# Patient Record
Sex: Female | Born: 1980 | Race: Asian | Hispanic: No | Marital: Married | Smoking: Never smoker
Health system: Southern US, Community
[De-identification: ages and names within clinical notes are randomized; demographics above are authoritative.]

## PROBLEM LIST (undated history)

## (undated) DIAGNOSIS — D649 Anemia, unspecified: Secondary | ICD-10-CM

## (undated) DIAGNOSIS — Z0000 Encounter for general adult medical examination without abnormal findings: Secondary | ICD-10-CM

## (undated) DIAGNOSIS — I10 Essential (primary) hypertension: Secondary | ICD-10-CM

## (undated) DIAGNOSIS — D563 Thalassemia minor: Secondary | ICD-10-CM

## (undated) DIAGNOSIS — I671 Cerebral aneurysm, nonruptured: Secondary | ICD-10-CM

## (undated) DIAGNOSIS — R109 Unspecified abdominal pain: Secondary | ICD-10-CM

## (undated) DIAGNOSIS — E785 Hyperlipidemia, unspecified: Secondary | ICD-10-CM

## (undated) HISTORY — DX: Anemia, unspecified: D64.9

## (undated) HISTORY — DX: Cerebral aneurysm, nonruptured: I67.1

## (undated) HISTORY — DX: Encounter for general adult medical examination without abnormal findings: Z00.00

## (undated) HISTORY — DX: Thalassemia minor: D56.3

## (undated) HISTORY — DX: Hyperlipidemia, unspecified: E78.5

## (undated) HISTORY — DX: Unspecified abdominal pain: R10.9

## (undated) HISTORY — PX: OVARIAN CYST REMOVAL: SHX89

## (undated) HISTORY — DX: Essential (primary) hypertension: I10

---

## 2011-05-03 DIAGNOSIS — I671 Cerebral aneurysm, nonruptured: Secondary | ICD-10-CM

## 2011-05-03 HISTORY — DX: Cerebral aneurysm, nonruptured: I67.1

## 2011-05-03 HISTORY — PX: OTHER SURGICAL HISTORY: SHX169

## 2012-05-28 ENCOUNTER — Ambulatory Visit (INDEPENDENT_AMBULATORY_CARE_PROVIDER_SITE_OTHER): Payer: BC Managed Care – PPO | Admitting: Family Medicine

## 2012-05-28 ENCOUNTER — Encounter: Payer: Self-pay | Admitting: Family Medicine

## 2012-05-28 VITALS — BP 110/80 | HR 78 | Temp 98.1°F | Ht 63.5 in | Wt 147.0 lb

## 2012-05-28 DIAGNOSIS — I10 Essential (primary) hypertension: Secondary | ICD-10-CM | POA: Insufficient documentation

## 2012-05-28 DIAGNOSIS — D563 Thalassemia minor: Secondary | ICD-10-CM | POA: Insufficient documentation

## 2012-05-28 DIAGNOSIS — Z0000 Encounter for general adult medical examination without abnormal findings: Secondary | ICD-10-CM

## 2012-05-28 DIAGNOSIS — I671 Cerebral aneurysm, nonruptured: Secondary | ICD-10-CM | POA: Insufficient documentation

## 2012-05-28 HISTORY — DX: Thalassemia minor: D56.3

## 2012-05-28 HISTORY — DX: Encounter for general adult medical examination without abnormal findings: Z00.00

## 2012-05-28 HISTORY — DX: Essential (primary) hypertension: I10

## 2012-05-28 MED ORDER — LISINOPRIL-HYDROCHLOROTHIAZIDE 20-25 MG PO TABS: 1.0000 | ORAL_TABLET | Freq: Every day | ORAL | Status: DC

## 2012-05-28 MED ORDER — METOPROLOL SUCCINATE ER 25 MG PO TB24: 25.0000 mg | ORAL_TABLET | Freq: Every day | ORAL | Status: DC

## 2012-05-28 NOTE — Progress Notes (Signed)
Patient ID: Sherri Underwood, female   DOB: Aug 21, 1980, 32 y.o.   MRN: [id number] Sherri Underwood [id number] [birth year] 05/28/2012      Progress Note New Patient  Subjective  Chief Complaint  Chief Complaint  Patient presents with  . Establish Care    new patient    HPI  32 year old female in today for new patient appointment. She has recently moved here from [place]. Her biggest of concern is that last year on 05/07/2011 she had a brain aneurysm rupture and spent 21 days in the hospital the lesion was coiled in she's been stable since that seemed to referral to a neurosurgeon in the area for surveillance. She's not complaining of any headaches or neurologic concerns at this time. She does note she has had some trouble with her blood pressure. Her blood pressures as high as 167/106. She has a Mirena IUD which is in need of changing sounds is going to establish with OB/GYN. She follows a vegetarian diet although she does eat eggs and dairy. During her last pregnancy she had high blood pressure and tolerated metoprolol.  Past Medical History  Diagnosis Date  . Hypertension   . Brain aneurysm 2-13  . HTN (hypertension) 05/28/2012  . Brain aneurysm 05/28/2012  . Thalassemia trait 05/28/2012  . Preventative health care 05/28/2012    Past Surgical History  Procedure Laterality Date  . Brain aneursym surgery  05-2011  . Ovarian cyst removal      on right, required 2 surgeries    Family History  Problem Relation Age of Onset  . Hypertension Mother   . Hyperlipidemia Mother   . Thyroid disease Mother   . Hypertension Father   . Alzheimer's disease Maternal Grandmother   . Hypertension Maternal Grandmother   . Hyperlipidemia Maternal Grandmother   . Diabetes Maternal Grandmother     type 2  . Parkinson's disease Maternal Grandfather   . Hypertension Paternal Grandfather   . Stroke Paternal Grandfather     History   Social History  . Marital Status: Married    Spouse Name: N/A     Number of Children: N/A  . Years of Education: N/A   Occupational History  . Not on file.   Social History Main Topics  . Smoking status: Never Smoker   . Smokeless tobacco: Never Used  . Alcohol Use: Yes     Comment: occasionally  . Drug Use: No  . Sexually Active: Yes -- Female partner(s)   Other Topics Concern  . Not on file   Social History Narrative  . No narrative on file    No current outpatient prescriptions on file prior to visit.   No current facility-administered medications on file prior to visit.    No Known Allergies  Review of Systems  Review of Systems  Constitutional: Negative for fever and malaise/fatigue.  HENT: Negative for congestion.   Eyes: Negative for pain and discharge.  Respiratory: Negative for shortness of breath.   Cardiovascular: Negative for chest pain, palpitations and leg swelling.  Gastrointestinal: Negative for nausea, abdominal pain and diarrhea.  Genitourinary: Negative for dysuria.  Musculoskeletal: Negative for falls.  Skin: Negative for rash.  Neurological: Negative for loss of consciousness and headaches.  Endo/Heme/Allergies: Negative for polydipsia.  Psychiatric/Behavioral: Negative for depression and suicidal ideas. The patient is not nervous/anxious and does not have insomnia.     Objective  BP 110/80  Pulse 78  Temp(Src) 98.1 F (36.7 C) (Oral)  Ht 5' 3.5" (1.613 m)  Wt 147 lb (66.679 kg)  BMI 25.63 kg/m2  SpO2 92%  LMP 05/02/2012  Physical Exam  Physical Exam  Constitutional: She is oriented to person, place, and time and well-developed, well-nourished, and in no distress. No distress.  HENT:  Head: Normocephalic and atraumatic.  Right Ear: External ear normal.  Left Ear: External ear normal.  Nose: Nose normal.  Mouth/Throat: Oropharynx is clear and moist. No oropharyngeal exudate.  Eyes: Conjunctivae are normal. Pupils are equal, round, and reactive to light. Right eye exhibits no discharge. Left  eye exhibits no discharge. No scleral icterus.  Neck: Normal range of motion. Neck supple. No thyromegaly present.  Cardiovascular: Normal rate, regular rhythm, normal heart sounds and intact distal pulses.   No murmur heard. Pulmonary/Chest: Effort normal and breath sounds normal. No respiratory distress. She has no wheezes. She has no rales.  Abdominal: Soft. Bowel sounds are normal. She exhibits no distension and no mass. There is no tenderness.  Musculoskeletal: Normal range of motion. She exhibits no edema and no tenderness.  Lymphadenopathy:    She has no cervical adenopathy.  Neurological: She is alert and oriented to person, place, and time. She has normal reflexes. No cranial nerve deficit. Coordination normal.  Skin: Skin is warm and dry. No rash noted. She is not diaphoretic.  Psychiatric: Mood, memory and affect normal.       Assessment & Plan  Preventative health care Recently here from [place]. Will request old records. Agrees to return for fasting labs and followup appointment in 2 months time.  Brain aneurysm Will refer to [facility] neurosurgical for surveillance. Have requested old records.  HTN (hypertension) Patient reporting blood pressures as high as the 160s over 90s Max of 167/106. Typically seeing numbers in the high 80s to low 90s diastolic. Toprol-XL 25 mg daily. Consider discontinuing lisinopril as she is considering a second pregnancy. Tolerated metoprolol during her previous pregnancy

## 2012-05-28 NOTE — Assessment & Plan Note (Signed)
Recently here from [place]. Will request old records. Agrees to return for fasting labs and followup appointment in 2 months time.

## 2012-05-28 NOTE — Assessment & Plan Note (Signed)
Will refer to [facility] neurosurgical for surveillance. Have requested old records.

## 2012-05-28 NOTE — Assessment & Plan Note (Signed)
Patient reporting blood pressures as high as the 160s over 90s Max of 167/106. Typically seeing numbers in the high 80s to low 90s diastolic. Toprol-XL 25 mg daily. Consider discontinuing lisinopril as she is considering a second pregnancy. Tolerated metoprolol during her previous pregnancy

## 2012-05-28 NOTE — Patient Instructions (Addendum)
Rel of rec neurosurgeon in [place],  [place] [address] [place], [place] Dr Loura Halt @ [facility], need notes, op report and images  Rel of Rec  [id number] [place], [place] Dr Georga Kaufmann, FP  LABS prior to next visit, lipid, renal, cbc, tsh, hepatic  Preventive Care for Adults, Female A healthy lifestyle and preventive care can promote health and wellness. Preventive health guidelines for women include the following key practices.  A routine yearly physical is a good way to check with your caregiver about your health and preventive screening. It is a chance to share any concerns and updates on your health, and to receive a thorough exam.  Visit your dentist for a routine exam and preventive care every 6 months. Brush your teeth twice a day and floss once a day. Good oral hygiene prevents tooth decay and gum disease.  The frequency of eye exams is based on your age, health, family medical history, use of contact lenses, and other factors. Follow your caregiver's recommendations for frequency of eye exams.  Eat a healthy diet. Foods like vegetables, fruits, whole grains, low-fat dairy products, and lean protein foods contain the nutrients you need without too many calories. Decrease your intake of foods high in solid fats, added sugars, and salt. Eat the right amount of calories for you.Get information about a proper diet from your caregiver, if necessary.  Regular physical exercise is one of the most important things you can do for your health. Most adults should get at least 150 minutes of moderate-intensity exercise (any activity that increases your heart rate and causes you to sweat) each week. In addition, most adults need muscle-strengthening exercises on 2 or more days a week.  Maintain a healthy weight. The body mass index (BMI) is a screening tool to identify possible weight problems. It provides an estimate of body fat based on height and weight. Your  caregiver can help determine your BMI, and can help you achieve or maintain a healthy weight.For adults 20 years and older:  A BMI below 18.5 is considered underweight.  A BMI of 18.5 to 24.9 is normal.  A BMI of 25 to 29.9 is considered overweight.  A BMI of 30 and above is considered obese.  Maintain normal blood lipids and cholesterol levels by exercising and minimizing your intake of saturated fat. Eat a balanced diet with plenty of fruit and vegetables. Blood tests for lipids and cholesterol should begin at age 40 and be repeated every 5 years. If your lipid or cholesterol levels are high, you are over 50, or you are at high risk for heart disease, you may need your cholesterol levels checked more frequently.Ongoing high lipid and cholesterol levels should be treated with medicines if diet and exercise are not effective.  If you smoke, find out from your caregiver how to quit. If you do not use tobacco, do not start.  If you are pregnant, do not drink alcohol. If you are breastfeeding, be very cautious about drinking alcohol. If you are not pregnant and choose to drink alcohol, do not exceed 1 drink per day. One drink is considered to be 12 ounces (355 mL) of beer, 5 ounces (148 mL) of wine, or 1.5 ounces (44 mL) of liquor.  Avoid use of street drugs. Do not share needles with anyone. Ask for help if you need support or instructions about stopping the use of drugs.  High blood pressure causes heart disease and increases the risk of  stroke. Your blood pressure should be checked at least every 1 to 2 years. Ongoing high blood pressure should be treated with medicines if weight loss and exercise are not effective.  If you are 53 to 33 years old, ask your caregiver if you should take aspirin to prevent strokes.  Diabetes screening involves taking a blood sample to check your fasting blood sugar level. This should be done once every 3 years, after age 85, if you are within normal weight and  without risk factors for diabetes. Testing should be considered at a younger age or be carried out more frequently if you are overweight and have at least 1 risk factor for diabetes.  Breast cancer screening is essential preventive care for women. You should practice "breast self-awareness." This means understanding the normal appearance and feel of your breasts and may include breast self-examination. Any changes detected, no matter how small, should be reported to a caregiver. Women in their 19s and 30s should have a clinical breast exam (CBE) by a caregiver as part of a regular health exam every 1 to 3 years. After age 38, women should have a CBE every year. Starting at age 38, women should consider having a mammography (breast X-ray test) every year. Women who have a family history of breast cancer should talk to their caregiver about genetic screening. Women at a high risk of breast cancer should talk to their caregivers about having magnetic resonance imaging (MRI) and a mammography every year.  The Pap test is a screening test for cervical cancer. A Pap test can show cell changes on the cervix that might become cervical cancer if left untreated. A Pap test is a procedure in which cells are obtained and examined from the lower end of the uterus (cervix).  Women should have a Pap test starting at age 73.  Between ages 65 and 37, Pap tests should be repeated every 2 years.  Beginning at age 50, you should have a Pap test every 3 years as long as the past 3 Pap tests have been normal.  Some women have medical problems that increase the chance of getting cervical cancer. Talk to your caregiver about these problems. It is especially important to talk to your caregiver if a new problem develops soon after your last Pap test. In these cases, your caregiver may recommend more frequent screening and Pap tests.  The above recommendations are the same for women who have or have not gotten the vaccine for  human papillomavirus (HPV).  If you had a hysterectomy for a problem that was not cancer or a condition that could lead to cancer, then you no longer need Pap tests. Even if you no longer need a Pap test, a regular exam is a good idea to make sure no other problems are starting.  If you are between ages 24 and 53, and you have had normal Pap tests going back 10 years, you no longer need Pap tests. Even if you no longer need a Pap test, a regular exam is a good idea to make sure no other problems are starting.  If you have had past treatment for cervical cancer or a condition that could lead to cancer, you need Pap tests and screening for cancer for at least 20 years after your treatment.  If Pap tests have been discontinued, risk factors (such as a new sexual partner) need to be reassessed to determine if screening should be resumed.  The HPV test is an additional  test that may be used for cervical cancer screening. The HPV test looks for the virus that can cause the cell changes on the cervix. The cells collected during the Pap test can be tested for HPV. The HPV test could be used to screen women aged 70 years and older, and should be used in women of any age who have unclear Pap test results. After the age of 23, women should have HPV testing at the same frequency as a Pap test.  Colorectal cancer can be detected and often prevented. Most routine colorectal cancer screening begins at the age of 53 and continues through age 36. However, your caregiver may recommend screening at an earlier age if you have risk factors for colon cancer. On a yearly basis, your caregiver may provide home test kits to check for hidden blood in the stool. Use of a small camera at the end of a tube, to directly examine the colon (sigmoidoscopy or colonoscopy), can detect the earliest forms of colorectal cancer. Talk to your caregiver about this at age 80, when routine screening begins. Direct examination of the colon should  be repeated every 5 to 10 years through age 2, unless early forms of pre-cancerous polyps or small growths are found.  Hepatitis C blood testing is recommended for all people born from [birth year] through 1965 and any individual with known risks for hepatitis C.  Practice safe sex. Use condoms and avoid high-risk sexual practices to reduce the spread of sexually transmitted infections (STIs). STIs include gonorrhea, chlamydia, syphilis, trichomonas, herpes, HPV, and human immunodeficiency virus (HIV). Herpes, HIV, and HPV are viral illnesses that have no cure. They can result in disability, cancer, and death. Sexually active women aged 46 and younger should be checked for chlamydia. Older women with new or multiple partners should also be tested for chlamydia. Testing for other STIs is recommended if you are sexually active and at increased risk.  Osteoporosis is a disease in which the bones lose minerals and strength with aging. This can result in serious bone fractures. The risk of osteoporosis can be identified using a bone density scan. Women ages 27 and over and women at risk for fractures or osteoporosis should discuss screening with their caregivers. Ask your caregiver whether you should take a calcium supplement or vitamin D to reduce the rate of osteoporosis.  Menopause can be associated with physical symptoms and risks. Hormone replacement therapy is available to decrease symptoms and risks. You should talk to your caregiver about whether hormone replacement therapy is right for you.  Use sunscreen with sun protection factor (SPF) of 30 or more. Apply sunscreen liberally and repeatedly throughout the day. You should seek shade when your shadow is shorter than you. Protect yourself by wearing long sleeves, pants, a wide-brimmed hat, and sunglasses year round, whenever you are outdoors.  Once a month, do a whole body skin exam, using a mirror to look at the skin on your back. Notify your caregiver of  new moles, moles that have irregular borders, moles that are larger than a pencil eraser, or moles that have changed in shape or color.  Stay current with required immunizations.  Influenza. You need a dose every fall (or winter). The composition of the flu vaccine changes each year, so being vaccinated once is not enough.  Pneumococcal polysaccharide. You need 1 to 2 doses if you smoke cigarettes or if you have certain chronic medical conditions. You need 1 dose at age 74 (or older) if you  have never been vaccinated.  Tetanus, diphtheria, pertussis (Tdap, Td). Get 1 dose of Tdap vaccine if you are younger than age 66, are over 53 and have contact with an infant, are a Research scientist (physical sciences), are pregnant, or simply want to be protected from whooping cough. After that, you need a Td booster dose every 10 years. Consult your caregiver if you have not had at least 3 tetanus and diphtheria-containing shots sometime in your life or have a deep or dirty wound.  HPV. You need this vaccine if you are a woman age 3 or younger. The vaccine is given in 3 doses over 6 months.  Measles, mumps, rubella (MMR). You need at least 1 dose of MMR if you were born in 1957 or later. You may also need a second dose.  Meningococcal. If you are age 21 to 49 and a first-year college student living in a residence hall, or have one of several medical conditions, you need to get vaccinated against meningococcal disease. You may also need additional booster doses.  Zoster (shingles). If you are age 60 or older, you should get this vaccine.  Varicella (chickenpox). If you have never had chickenpox or you were vaccinated but received only 1 dose, talk to your caregiver to find out if you need this vaccine.  Hepatitis A. You need this vaccine if you have a specific risk factor for hepatitis A virus infection or you simply wish to be protected from this disease. The vaccine is usually given as 2 doses, 6 to 18 months  apart.  Hepatitis B. You need this vaccine if you have a specific risk factor for hepatitis B virus infection or you simply wish to be protected from this disease. The vaccine is given in 3 doses, usually over 6 months. Preventive Services / Frequency Ages 61 to 78  Blood pressure check.** / Every 1 to 2 years.  Lipid and cholesterol check.** / Every 5 years beginning at age 70.  Clinical breast exam.** / Every 3 years for women in their 52s and 30s.  Pap test.** / Every 2 years from ages 68 through 33. Every 3 years starting at age 28 through age 78 or 36 with a history of 3 consecutive normal Pap tests.  HPV screening.** / Every 3 years from ages 50 through ages 51 to 86 with a history of 3 consecutive normal Pap tests.  Hepatitis C blood test.** / For any individual with known risks for hepatitis C.  Skin self-exam. / Monthly.  Influenza immunization.** / Every year.  Pneumococcal polysaccharide immunization.** / 1 to 2 doses if you smoke cigarettes or if you have certain chronic medical conditions.  Tetanus, diphtheria, pertussis (Tdap, Td) immunization. / A one-time dose of Tdap vaccine. After that, you need a Td booster dose every 10 years.  HPV immunization. / 3 doses over 6 months, if you are 34 and younger.  Measles, mumps, rubella (MMR) immunization. / You need at least 1 dose of MMR if you were born in 1957 or later. You may also need a second dose.  Meningococcal immunization. / 1 dose if you are age 53 to 46 and a first-year college student living in a residence hall, or have one of several medical conditions, you need to get vaccinated against meningococcal disease. You may also need additional booster doses.  Varicella immunization.** / Consult your caregiver.  Hepatitis A immunization.** / Consult your caregiver. 2 doses, 6 to 18 months apart.  Hepatitis B immunization.** / Consult your  caregiver. 3 doses usually over 6 months. Ages 79 to 46  Blood pressure  check.** / Every 1 to 2 years.  Lipid and cholesterol check.** / Every 5 years beginning at age 56.  Clinical breast exam.** / Every year after age 46.  Mammogram.** / Every year beginning at age 60 and continuing for as long as you are in good health. Consult with your caregiver.  Pap test.** / Every 3 years starting at age 21 through age 27 or 67 with a history of 3 consecutive normal Pap tests.  HPV screening.** / Every 3 years from ages 75 through ages 110 to 75 with a history of 3 consecutive normal Pap tests.  Fecal occult blood test (FOBT) of stool. / Every year beginning at age 88 and continuing until age 28. You may not need to do this test if you get a colonoscopy every 10 years.  Flexible sigmoidoscopy or colonoscopy.** / Every 5 years for a flexible sigmoidoscopy or every 10 years for a colonoscopy beginning at age 74 and continuing until age 1.  Hepatitis C blood test.** / For all people born from [birth year] through 1965 and any individual with known risks for hepatitis C.  Skin self-exam. / Monthly.  Influenza immunization.** / Every year.  Pneumococcal polysaccharide immunization.** / 1 to 2 doses if you smoke cigarettes or if you have certain chronic medical conditions.  Tetanus, diphtheria, pertussis (Tdap, Td) immunization.** / A one-time dose of Tdap vaccine. After that, you need a Td booster dose every 10 years.  Measles, mumps, rubella (MMR) immunization. / You need at least 1 dose of MMR if you were born in 1957 or later. You may also need a second dose.  Varicella immunization.** / Consult your caregiver.  Meningococcal immunization.** / Consult your caregiver.  Hepatitis A immunization.** / Consult your caregiver. 2 doses, 6 to 18 months apart.  Hepatitis B immunization.** / Consult your caregiver. 3 doses, usually over 6 months. Ages 61 and over  Blood pressure check.** / Every 1 to 2 years.  Lipid and cholesterol check.** / Every 5 years beginning at age  69.  Clinical breast exam.** / Every year after age 96.  Mammogram.** / Every year beginning at age 51 and continuing for as long as you are in good health. Consult with your caregiver.  Pap test.** / Every 3 years starting at age 47 through age 51 or 71 with a 3 consecutive normal Pap tests. Testing can be stopped between 65 and 70 with 3 consecutive normal Pap tests and no abnormal Pap or HPV tests in the past 10 years.  HPV screening.** / Every 3 years from ages 55 through ages 20 or 63 with a history of 3 consecutive normal Pap tests. Testing can be stopped between 65 and 70 with 3 consecutive normal Pap tests and no abnormal Pap or HPV tests in the past 10 years.  Fecal occult blood test (FOBT) of stool. / Every year beginning at age 26 and continuing until age 34. You may not need to do this test if you get a colonoscopy every 10 years.  Flexible sigmoidoscopy or colonoscopy.** / Every 5 years for a flexible sigmoidoscopy or every 10 years for a colonoscopy beginning at age 23 and continuing until age 105.  Hepatitis C blood test.** / For all people born from [birth year] through 1965 and any individual with known risks for hepatitis C.  Osteoporosis screening.** / A one-time screening for women ages 6 and over and women  at risk for fractures or osteoporosis.  Skin self-exam. / Monthly.  Influenza immunization.** / Every year.  Pneumococcal polysaccharide immunization.** / 1 dose at age 11 (or older) if you have never been vaccinated.  Tetanus, diphtheria, pertussis (Tdap, Td) immunization. / A one-time dose of Tdap vaccine if you are over 65 and have contact with an infant, are a Research scientist (physical sciences), or simply want to be protected from whooping cough. After that, you need a Td booster dose every 10 years.  Varicella immunization.** / Consult your caregiver.  Meningococcal immunization.** / Consult your caregiver.  Hepatitis A immunization.** / Consult your caregiver. 2 doses, 6 to 18  months apart.  Hepatitis B immunization.** / Check with your caregiver. 3 doses, usually over 6 months. ** Family history and personal history of risk and conditions may change your caregiver's recommendations. Document Released: 05/14/2001 Document Revised: 06/10/2011 Document Reviewed: 08/13/2010 [facility] Patient Information 2013 [place], [place].

## 2012-06-11 ENCOUNTER — Telehealth: Payer: Self-pay | Admitting: Family Medicine

## 2012-06-11 NOTE — Telephone Encounter (Signed)
Received medical records from [facility]- Dr. Loura Halt  P: [phone] F: [phone]

## 2012-07-14 ENCOUNTER — Telehealth: Payer: Self-pay | Admitting: Family Medicine

## 2012-07-14 NOTE — Telephone Encounter (Signed)
Received medical records from [facility]

## 2012-07-27 ENCOUNTER — Ambulatory Visit: Payer: BC Managed Care – PPO | Admitting: Family Medicine

## 2012-09-04 ENCOUNTER — Other Ambulatory Visit: Payer: Self-pay | Admitting: Neurosurgery

## 2012-09-04 DIAGNOSIS — I609 Nontraumatic subarachnoid hemorrhage, unspecified: Secondary | ICD-10-CM

## 2012-09-11 ENCOUNTER — Other Ambulatory Visit: Payer: BC Managed Care – PPO

## 2012-12-24 ENCOUNTER — Telehealth: Payer: Self-pay | Admitting: Family Medicine

## 2012-12-24 DIAGNOSIS — Z0000 Encounter for general adult medical examination without abnormal findings: Secondary | ICD-10-CM

## 2012-12-24 DIAGNOSIS — I10 Essential (primary) hypertension: Secondary | ICD-10-CM

## 2012-12-24 NOTE — Telephone Encounter (Signed)
LABS prior to next visit, lipid, renal, cbc, tsh, hepatic   Patient has appointment in early October. She will be going to [facility]

## 2012-12-31 ENCOUNTER — Other Ambulatory Visit: Payer: Self-pay | Admitting: Family Medicine

## 2012-12-31 LAB — RENAL FUNCTION PANEL
CO2: 26 mEq/L (ref 19–32)
Calcium: 9.1 mg/dL (ref 8.4–10.5)
Chloride: 106 mEq/L (ref 96–112)
Glucose, Bld: 93 mg/dL (ref 70–99)
Potassium: 4.1 mEq/L (ref 3.5–5.3)
Sodium: 139 mEq/L (ref 135–145)

## 2012-12-31 LAB — CBC
HCT: 33.8 % — ABNORMAL LOW (ref 36.0–46.0)
Hemoglobin: 11.2 g/dL — ABNORMAL LOW (ref 12.0–15.0)
MCH: 19.5 pg — ABNORMAL LOW (ref 26.0–34.0)
MCHC: 33.1 g/dL (ref 30.0–36.0)
MCV: 58.8 fL — ABNORMAL LOW (ref 78.0–100.0)

## 2012-12-31 LAB — HEPATIC FUNCTION PANEL
ALT: 17 U/L (ref 0–35)
Albumin: 4.2 g/dL (ref 3.5–5.2)
Alkaline Phosphatase: 37 U/L — ABNORMAL LOW (ref 39–117)
Bilirubin, Direct: 0.2 mg/dL (ref 0.0–0.3)
Indirect Bilirubin: 1.1 mg/dL — ABNORMAL HIGH (ref 0.0–0.9)
Total Protein: 6.5 g/dL (ref 6.0–8.3)

## 2012-12-31 LAB — LIPID PANEL
LDL Cholesterol: 59 mg/dL (ref 0–99)
Triglycerides: 142 mg/dL (ref <150)
VLDL: 28 mg/dL (ref 0–40)

## 2013-01-04 ENCOUNTER — Other Ambulatory Visit: Payer: Self-pay | Admitting: *Deleted

## 2013-01-04 DIAGNOSIS — I10 Essential (primary) hypertension: Secondary | ICD-10-CM

## 2013-01-04 MED ORDER — LISINOPRIL-HYDROCHLOROTHIAZIDE 20-25 MG PO TABS: 1.0000 | ORAL_TABLET | Freq: Every day | ORAL | Status: DC

## 2013-01-04 NOTE — Telephone Encounter (Signed)
Rx request to pharmacy, #[id number]; Office Visit Needed Prior to Future Refills/SLS

## 2013-01-07 ENCOUNTER — Encounter: Payer: Self-pay | Admitting: Family Medicine

## 2013-01-07 ENCOUNTER — Ambulatory Visit (INDEPENDENT_AMBULATORY_CARE_PROVIDER_SITE_OTHER): Payer: BC Managed Care – PPO | Admitting: Family Medicine

## 2013-01-07 VITALS — BP 122/88 | HR 72 | Temp 98.3°F | Ht 63.5 in | Wt 151.0 lb

## 2013-01-07 DIAGNOSIS — I10 Essential (primary) hypertension: Secondary | ICD-10-CM

## 2013-01-07 DIAGNOSIS — D649 Anemia, unspecified: Secondary | ICD-10-CM

## 2013-01-07 DIAGNOSIS — E785 Hyperlipidemia, unspecified: Secondary | ICD-10-CM

## 2013-01-07 DIAGNOSIS — D563 Thalassemia minor: Secondary | ICD-10-CM

## 2013-01-07 HISTORY — DX: Hyperlipidemia, unspecified: E78.5

## 2013-01-07 HISTORY — DX: Anemia, unspecified: D64.9

## 2013-01-07 MED ORDER — LISINOPRIL-HYDROCHLOROTHIAZIDE 20-25 MG PO TABS: 1.0000 | ORAL_TABLET | Freq: Every day | ORAL | Status: DC

## 2013-01-07 NOTE — Patient Instructions (Signed)
Flaxseed oil and probiotics  Cholesterol Cholesterol is a white, waxy, fat-like protein needed by your body in small amounts. The liver makes all the cholesterol you need. It is carried from the liver by the blood through the blood vessels. Deposits (plaque) may build up on blood vessel walls. This makes the arteries narrower and stiffer. Plaque increases the risk for heart attack and stroke. You cannot feel your cholesterol level even if it is very high. The only way to know is by a blood test to check your lipid (fats) levels. Once you know your cholesterol levels, you should keep a record of the test results. Work with your caregiver to to keep your levels in the desired range. WHAT THE RESULTS MEAN:  Total cholesterol is a rough measure of all the cholesterol in your blood.  LDL is the so-called bad cholesterol. This is the type that deposits cholesterol in the walls of the arteries. You want this level to be low.  HDL is the good cholesterol because it cleans the arteries and carries the LDL away. You want this level to be high.  Triglycerides are fat that the body can either burn for energy or store. High levels are closely linked to heart disease. DESIRED LEVELS:  Total cholesterol below 200.  LDL below 100 for people at risk, below 70 for very high risk.  HDL above 50 is good, above 60 is best.  Triglycerides below 150. HOW TO LOWER YOUR CHOLESTEROL:  Diet.  Choose fish or white meat chicken and [place], roasted or baked. Limit fatty cuts of red meat, fried foods, and processed meats, such as sausage and lunch meat.  Eat lots of fresh fruits and vegetables. Choose whole grains, beans, pasta, potatoes and cereals.  Use only small amounts of olive, corn or canola oils. Avoid butter, mayonnaise, shortening or palm kernel oils. Avoid foods with trans-fats.  Use skim/nonfat milk and low-fat/nonfat yogurt and cheeses. Avoid whole milk, cream, ice cream, egg yolks and cheeses.  Healthy desserts include angel food cake, ginger snaps, animal crackers, hard candy, popsicles, and low-fat/nonfat frozen yogurt. Avoid pastries, cakes, pies and cookies.  Exercise.  A regular program helps decrease LDL and raises HDL.  Helps with weight control.  Do things that increase your activity level like gardening, walking, or taking the stairs.  Medication.  May be prescribed by your caregiver to help lowering cholesterol and the risk for heart disease.  You may need medicine even if your levels are normal if you have several risk factors. HOME CARE INSTRUCTIONS   Follow your diet and exercise programs as suggested by your caregiver.  Take medications as directed.  Have blood work done when your caregiver feels it is necessary. MAKE SURE YOU:   Understand these instructions.  Will watch your condition.  Will get help right away if you are not doing well or get worse. Document Released: 12/11/2000 Document Revised: 06/10/2011 Document Reviewed: 06/03/2007 [facility] Patient Information 2014 [place], [place].

## 2013-01-07 NOTE — Progress Notes (Signed)
Patient ID: Sherri Underwood, female   DOB: Aug 30, 1980, 32 y.o.   MRN: [id number] Asucena Galer [id number] 1980-08-15 01/07/2013      Progress Note-Follow Up  Subjective  Chief Complaint  Chief Complaint  Patient presents with  . Follow-up    HPI  Patient is a 32 year old female who is in today for followup. She feels well. Declines flu shot. Denies fevers or chills. No chest pain, palpitations, shortness of breath, GI or GU concerns are noted at this time.  Past Medical History  Diagnosis Date  . Hypertension   . Brain aneurysm 2-13  . HTN (hypertension) 05/28/2012  . Brain aneurysm 05/28/2012  . Thalassemia trait 05/28/2012  . Preventative health care 05/28/2012    Past Surgical History  Procedure Laterality Date  . Brain aneursym surgery  05-2011  . Ovarian cyst removal      on right, required 2 surgeries    Family History  Problem Relation Age of Onset  . Hypertension Mother   . Hyperlipidemia Mother   . Thyroid disease Mother   . Hypertension Father   . Alzheimer's disease Maternal Grandmother   . Hypertension Maternal Grandmother   . Hyperlipidemia Maternal Grandmother   . Diabetes Maternal Grandmother     type 2  . Parkinson's disease Maternal Grandfather   . Hypertension Paternal Grandfather   . Stroke Paternal Grandfather     History   Social History  . Marital Status: Married    Spouse Name: N/A    Number of Children: N/A  . Years of Education: N/A   Occupational History  . Not on file.   Social History Main Topics  . Smoking status: Never Smoker   . Smokeless tobacco: Never Used  . Alcohol Use: Yes     Comment: occasionally  . Drug Use: No  . Sexual Activity: Yes    Partners: Male   Other Topics Concern  . Not on file   Social History Narrative  . No narrative on file    Current Outpatient Prescriptions on File Prior to Visit  Medication Sig Dispense Refill  . aspirin 325 MG tablet Take 325 mg by mouth daily.      . Multiple Vitamin  (MULTIVITAMIN) tablet Take 1 tablet by mouth daily.       No current facility-administered medications on file prior to visit.    No Known Allergies  Review of Systems  Review of Systems  Constitutional: Negative for fever and malaise/fatigue.  HENT: Negative for congestion.   Eyes: Negative for discharge.  Respiratory: Negative for shortness of breath.   Cardiovascular: Negative for chest pain, palpitations and leg swelling.  Gastrointestinal: Negative for nausea, abdominal pain and diarrhea.  Genitourinary: Negative for dysuria.  Musculoskeletal: Negative for falls.  Skin: Negative for rash.  Neurological: Negative for loss of consciousness and headaches.  Endo/Heme/Allergies: Negative for polydipsia.  Psychiatric/Behavioral: Negative for depression and suicidal ideas. The patient is not nervous/anxious and does not have insomnia.     Objective  BP 122/88  Pulse 72  Temp(Src) 98.3 F (36.8 C) (Oral)  Ht 5' 3.5" (1.613 m)  Wt 151 lb (68.493 kg)  BMI 26.33 kg/m2  SpO2 97%  LMP 12/30/2012  Physical Exam  Physical Exam  Constitutional: She is oriented to person, place, and time and well-developed, well-nourished, and in no distress. No distress.  HENT:  Head: Normocephalic and atraumatic.  Eyes: Conjunctivae are normal.  Neck: Neck supple. No thyromegaly present.  Cardiovascular: Normal rate, regular rhythm and normal  heart sounds.   No murmur heard. Pulmonary/Chest: Effort normal and breath sounds normal. She has no wheezes.  Abdominal: She exhibits no distension and no mass.  Musculoskeletal: She exhibits no edema.  Lymphadenopathy:    She has no cervical adenopathy.  Neurological: She is alert and oriented to person, place, and time.  Skin: Skin is warm and dry. No rash noted. She is not diaphoretic.  Psychiatric: Memory, affect and judgment normal.    Lab Results  Component Value Date   TSH 3.268 12/31/2012   Lab Results  Component Value Date   WBC 6.1  12/31/2012   HGB 11.2* 12/31/2012   HCT 33.8* 12/31/2012   MCV 58.8* 12/31/2012   PLT 323 12/31/2012   Lab Results  Component Value Date   CREATININE 0.69 12/31/2012   BUN 6 12/31/2012   NA 139 12/31/2012   K 4.1 12/31/2012   CL 106 12/31/2012   CO2 26 12/31/2012   Lab Results  Component Value Date   ALT 17 12/31/2012   AST 19 12/31/2012   ALKPHOS 37* 12/31/2012   BILITOT 1.3* 12/31/2012   Lab Results  Component Value Date   CHOL 121 12/31/2012   Lab Results  Component Value Date   HDL 34* 12/31/2012   Lab Results  Component Value Date   LDLCALC 59 12/31/2012   Lab Results  Component Value Date   TRIG 142 12/31/2012   Lab Results  Component Value Date   CHOLHDL 3.6 12/31/2012     Assessment & Plan  HTN (hypertension) Well controlled, no changes.  Dyslipidemia Mildly suppressed HDL cholesteorl, encouraged fatty acid supplement, heart healthy diet and increased exercise  Thalassemia trait Stable mild anemia per patient

## 2013-01-10 NOTE — Assessment & Plan Note (Signed)
Mildly suppressed HDL cholesteorl, encouraged fatty acid supplement, heart healthy diet and increased exercise

## 2013-01-10 NOTE — Assessment & Plan Note (Signed)
Well controlled, no changes 

## 2013-01-10 NOTE — Assessment & Plan Note (Signed)
Stable mild anemia per patient

## 2013-01-13 ENCOUNTER — Telehealth: Payer: Self-pay | Admitting: *Deleted

## 2013-01-13 NOTE — Telephone Encounter (Signed)
Received message from pt stating she forgot to bring her biometric form with her to her last visit (10/9) and wanted to know if we could still complete the form. Attempted to reach the pt and left message to return my call.

## 2013-01-15 ENCOUNTER — Telehealth: Payer: Self-pay

## 2013-01-15 NOTE — Telephone Encounter (Signed)
Patient called and stated that she had some forms filled out and faxed for insurance purposes but she realized that she brought the wrong form. Pt requests to fax correct form here (I told her it was okay) to be signed and faxed. Once this is done please notify patient that it has been taken care of or if it cannot be done. Thanks.

## 2013-02-04 ENCOUNTER — Telehealth: Payer: Self-pay | Admitting: *Deleted

## 2013-02-04 NOTE — Telephone Encounter (Signed)
I do not recall have we seen or done this?

## 2013-02-04 NOTE — Telephone Encounter (Signed)
Patient called requesting status of Biometric Screening Form faxed for completion/SLS Please Advise.

## 2013-02-05 NOTE — Telephone Encounter (Signed)
I looked back 2-3 weeks in my faxed sheet and I haven't faxed it nor do I remember seeing this either. I left a message for patient to refax to [phone]

## 2013-02-10 ENCOUNTER — Telehealth: Payer: Self-pay

## 2013-02-10 NOTE — Telephone Encounter (Signed)
Patient called about Biometric screening form. Stated that she never got a message about re-faxing the form. Patient is scheduled for a office visit tomorrow and said that she would discuss with PCP then about the form.

## 2013-02-11 ENCOUNTER — Ambulatory Visit (INDEPENDENT_AMBULATORY_CARE_PROVIDER_SITE_OTHER): Payer: BC Managed Care – PPO | Admitting: Family Medicine

## 2013-02-11 ENCOUNTER — Encounter: Payer: Self-pay | Admitting: Family Medicine

## 2013-02-11 VITALS — BP 108/80 | HR 83 | Temp 98.4°F | Resp 14 | Ht 63.5 in | Wt 148.0 lb

## 2013-02-11 DIAGNOSIS — D649 Anemia, unspecified: Secondary | ICD-10-CM

## 2013-02-11 DIAGNOSIS — R109 Unspecified abdominal pain: Secondary | ICD-10-CM

## 2013-02-11 DIAGNOSIS — I10 Essential (primary) hypertension: Secondary | ICD-10-CM

## 2013-02-11 NOTE — Progress Notes (Signed)
Patient ID: Sherri Underwood, female   DOB: 10/12/1980, 32 y.o.   MRN: [id number] Sherri Underwood [id number] [birth year] 02/11/2013      Progress Note-Follow Up  Subjective  Chief Complaint  Chief Complaint  Patient presents with  . Abdominal Pain    Pt c/o sharp pains in suprapubic area of lower abdomen with fever began x1 wk ago    HPI  Patient is a 32 year old female who is in today to discuss abdominal pain. About a week ago she had some low-grade fevers and abdominal pain. Located in the lower abdomen but diffuse. Pain was somewhat sharp but is now resolved. She had some nausea and anorexia as well as fatigue and low-grade fevers. At this point she feels better has maintained a bland diet for several days and no longer has pain or fever. No bloody or tarry stool. No constipation or diarrhea was noted. Some nausea but no vomiting. Otherwise she reports feeling well and denies headaches, congestion or other concerns.  Past Medical History  Diagnosis Date  . Hypertension   . Brain aneurysm 2-13  . HTN (hypertension) 05/28/2012  . Brain aneurysm 05/28/2012  . Thalassemia trait 05/28/2012  . Preventative health care 05/28/2012  . Anemia 01/07/2013  . Dyslipidemia 01/07/2013    Past Surgical History  Procedure Laterality Date  . Brain aneursym surgery  05-2011  . Ovarian cyst removal      on right, required 2 surgeries    Family History  Problem Relation Age of Onset  . Hypertension Mother   . Hyperlipidemia Mother   . Thyroid disease Mother   . Hypertension Father   . Alzheimer's disease Maternal Grandmother   . Hypertension Maternal Grandmother   . Hyperlipidemia Maternal Grandmother   . Diabetes Maternal Grandmother     type 2  . Parkinson's disease Maternal Grandfather   . Hypertension Paternal Grandfather   . Stroke Paternal Grandfather     History   Social History  . Marital Status: Married    Spouse Name: N/A    Number of Children: N/A  . Years of Education: N/A    Occupational History  . Not on file.   Social History Main Topics  . Smoking status: Never Smoker   . Smokeless tobacco: Never Used  . Alcohol Use: Yes     Comment: occasionally  . Drug Use: No  . Sexual Activity: Yes    Partners: Male   Other Topics Concern  . Not on file   Social History Narrative  . No narrative on file    Current Outpatient Prescriptions on File Prior to Visit  Medication Sig Dispense Refill  . aspirin 325 MG tablet Take 325 mg by mouth daily.      Marland Kitchen lisinopril-hydrochlorothiazide (PRINZIDE,ZESTORETIC) 20-25 MG per tablet Take 1 tablet by mouth daily.  90 tablet  3  . Multiple Vitamin (MULTIVITAMIN) tablet Take 1 tablet by mouth daily.       No current facility-administered medications on file prior to visit.    No Known Allergies  Review of Systems  Review of Systems  Constitutional: Negative for fever and malaise/fatigue.  HENT: Negative for congestion.   Eyes: Negative for discharge.  Respiratory: Negative for shortness of breath.   Cardiovascular: Negative for chest pain, palpitations and leg swelling.  Gastrointestinal: Negative for nausea, abdominal pain and diarrhea.  Genitourinary: Negative for dysuria.  Musculoskeletal: Negative for falls.  Skin: Negative for rash.  Neurological: Negative for loss of consciousness and headaches.  Endo/Heme/Allergies: Negative  for polydipsia.  Psychiatric/Behavioral: Negative for depression and suicidal ideas. The patient is not nervous/anxious and does not have insomnia.     Objective  BP 108/80  Pulse 83  Temp(Src) 98.4 F (36.9 C) (Oral)  Resp 14  Ht 5' 3.5" (1.613 m)  Wt 148 lb (67.132 kg)  BMI 25.80 kg/m2  SpO2 98%  LMP 01/29/2013  Physical Exam  Physical Exam  Constitutional: She is oriented to person, place, and time and well-developed, well-nourished, and in no distress. No distress.  HENT:  Head: Normocephalic and atraumatic.  Eyes: Conjunctivae are normal.  Neck: Neck  supple. No thyromegaly present.  Cardiovascular: Normal rate, regular rhythm and normal heart sounds.   No murmur heard. Pulmonary/Chest: Effort normal and breath sounds normal. She has no wheezes.  Abdominal: She exhibits no distension and no mass.  Musculoskeletal: She exhibits no edema.  Lymphadenopathy:    She has no cervical adenopathy.  Neurological: She is alert and oriented to person, place, and time.  Skin: Skin is warm and dry. No rash noted. She is not diaphoretic.  Psychiatric: Memory, affect and judgment normal.    Lab Results  Component Value Date   TSH 3.268 12/31/2012   Lab Results  Component Value Date   WBC 6.1 12/31/2012   HGB 11.2* 12/31/2012   HCT 33.8* 12/31/2012   MCV 58.8* 12/31/2012   PLT 323 12/31/2012   Lab Results  Component Value Date   CREATININE 0.69 12/31/2012   BUN 6 12/31/2012   NA 139 12/31/2012   K 4.1 12/31/2012   CL 106 12/31/2012   CO2 26 12/31/2012   Lab Results  Component Value Date   ALT 17 12/31/2012   AST 19 12/31/2012   ALKPHOS 37* 12/31/2012   BILITOT 1.3* 12/31/2012   Lab Results  Component Value Date   CHOL 121 12/31/2012   Lab Results  Component Value Date   HDL 34* 12/31/2012   Lab Results  Component Value Date   LDLCALC 59 12/31/2012   Lab Results  Component Value Date   TRIG 142 12/31/2012   Lab Results  Component Value Date   CHOLHDL 3.6 12/31/2012     Assessment & Plan  HTN (hypertension) Well controlled, no changes.   Anemia Mild, encouraged leafy greens and increased iron in diet  Abdominal pain, unspecified site Better today, suspect gastroenteritis, encouraged bland diet and clear fluids, start a probiotic  And report if symptoms due not resolve or worsen

## 2013-02-11 NOTE — Progress Notes (Signed)
Pre visit review using our clinic review tool, if applicable. No additional management support is needed unless otherwise documented below in the visit note/SLS  

## 2013-02-11 NOTE — Patient Instructions (Signed)

## 2013-02-14 ENCOUNTER — Encounter: Payer: Self-pay | Admitting: Family Medicine

## 2013-02-14 DIAGNOSIS — R109 Unspecified abdominal pain: Secondary | ICD-10-CM

## 2013-02-14 HISTORY — DX: Unspecified abdominal pain: R10.9

## 2013-02-14 NOTE — Assessment & Plan Note (Signed)
Well controlled, no changes 

## 2013-02-14 NOTE — Assessment & Plan Note (Signed)
Mild, encouraged leafy greens and increased iron in diet

## 2013-02-14 NOTE — Assessment & Plan Note (Signed)
Better today, suspect gastroenteritis, encouraged bland diet and clear fluids, start a probiotic  And report if symptoms due not resolve or worsen

## 2013-10-30 LAB — HM PAP SMEAR: HM Pap smear: NORMAL

## 2013-11-02 ENCOUNTER — Telehealth: Payer: Self-pay | Admitting: Family Medicine

## 2013-11-02 NOTE — Telephone Encounter (Signed)
Do you know anything about this paper work?  

## 2013-11-02 NOTE — Telephone Encounter (Signed)
I haven't seen this

## 2013-11-02 NOTE — Telephone Encounter (Signed)
You might need to check with the front staff? I don't see any note in the computer stating paperwork was dropped of/ nor have we seen any?

## 2013-11-02 NOTE — Telephone Encounter (Signed)
Can you call and inform the patient that no one has seen this paperwork?

## 2013-11-02 NOTE — Telephone Encounter (Signed)
Patient left message stating she dropped off Biometric screening paperwrok last month and her insurance company has not yet received it, wants to know the status of this

## 2013-12-16 ENCOUNTER — Other Ambulatory Visit: Payer: Self-pay | Admitting: Neurosurgery

## 2013-12-16 DIAGNOSIS — I609 Nontraumatic subarachnoid hemorrhage, unspecified: Secondary | ICD-10-CM

## 2013-12-24 ENCOUNTER — Encounter (INDEPENDENT_AMBULATORY_CARE_PROVIDER_SITE_OTHER): Payer: Self-pay

## 2013-12-24 ENCOUNTER — Ambulatory Visit
Admission: RE | Admit: 2013-12-24 | Discharge: 2013-12-24 | Disposition: A | Discharge status: Home / Self Care | Payer: BC Managed Care – PPO | Source: Ambulatory Visit | Attending: Neurosurgery | Admitting: Neurosurgery

## 2013-12-24 DIAGNOSIS — I609 Nontraumatic subarachnoid hemorrhage, unspecified: Secondary | ICD-10-CM

## 2013-12-24 MED ORDER — IOHEXOL 350 MG/ML SOLN
70.0000 mL | Freq: Once | INTRAVENOUS | Status: AC | PRN
  Administered 2013-12-24: 70 mL via INTRAVENOUS

## 2014-01-10 ENCOUNTER — Encounter: Payer: Self-pay | Admitting: Family Medicine

## 2014-01-10 ENCOUNTER — Ambulatory Visit (INDEPENDENT_AMBULATORY_CARE_PROVIDER_SITE_OTHER): Payer: BC Managed Care – PPO | Admitting: Family Medicine

## 2014-01-10 VITALS — BP 126/104 | HR 67 | Temp 98.1°F | Ht 63.5 in | Wt 155.0 lb

## 2014-01-10 DIAGNOSIS — I10 Essential (primary) hypertension: Secondary | ICD-10-CM | POA: Diagnosis not present

## 2014-01-10 DIAGNOSIS — Z0000 Encounter for general adult medical examination without abnormal findings: Secondary | ICD-10-CM

## 2014-01-10 DIAGNOSIS — E785 Hyperlipidemia, unspecified: Secondary | ICD-10-CM | POA: Diagnosis not present

## 2014-01-10 DIAGNOSIS — D649 Anemia, unspecified: Secondary | ICD-10-CM | POA: Diagnosis not present

## 2014-01-10 DIAGNOSIS — I671 Cerebral aneurysm, nonruptured: Secondary | ICD-10-CM

## 2014-01-10 LAB — CBC
HCT: 35.4 % — ABNORMAL LOW (ref 36.0–46.0)
Hemoglobin: 10.9 g/dL — ABNORMAL LOW (ref 12.0–15.0)
MCHC: 30.7 g/dL (ref 30.0–36.0)
MCV: 61.6 fl — ABNORMAL LOW (ref 78.0–100.0)
PLATELETS: 268 10*3/uL (ref 150.0–400.0)
RBC: 5.75 Mil/uL — AB (ref 3.87–5.11)
RDW: 16.3 % — ABNORMAL HIGH (ref 11.5–15.5)
WBC: 6 10*3/uL (ref 4.0–10.5)

## 2014-01-10 LAB — LIPID PANEL
Cholesterol: 173 mg/dL (ref 0–200)
HDL: 33.4 mg/dL — AB (ref >39.00)
NonHDL: 139.6
TRIGLYCERIDES: 267 mg/dL — AB (ref 0.0–149.0)
Total CHOL/HDL Ratio: 5
VLDL: 53.4 mg/dL — ABNORMAL HIGH (ref 0.0–40.0)

## 2014-01-10 LAB — RENAL FUNCTION PANEL
Albumin: 3.9 g/dL (ref 3.5–5.2)
BUN: 8 mg/dL (ref 6–23)
CO2: 26 mEq/L (ref 19–32)
CREATININE: 0.8 mg/dL (ref 0.4–1.2)
Calcium: 9.2 mg/dL (ref 8.4–10.5)
Chloride: 103 mEq/L (ref 96–112)
GFR: 88.23 mL/min (ref >60.00)
Glucose, Bld: 94 mg/dL (ref 70–99)
Phosphorus: 3.1 mg/dL (ref 2.3–4.6)
Potassium: 3.5 mEq/L (ref 3.5–5.1)
Sodium: 138 mEq/L (ref 135–145)

## 2014-01-10 LAB — HEPATIC FUNCTION PANEL
ALBUMIN: 3.9 g/dL (ref 3.5–5.2)
ALK PHOS: 36 U/L — AB (ref 39–117)
ALT: 32 U/L (ref 0–35)
AST: 23 U/L (ref 0–37)
Bilirubin, Direct: 0 mg/dL (ref 0.0–0.3)
TOTAL PROTEIN: 7.6 g/dL (ref 6.0–8.3)
Total Bilirubin: 1 mg/dL (ref 0.2–1.2)

## 2014-01-10 LAB — LDL CHOLESTEROL, DIRECT: Direct LDL: 95.9 mg/dL

## 2014-01-10 LAB — TSH: TSH: 4.3 u[IU]/mL (ref 0.35–4.50)

## 2014-01-10 MED ORDER — LISINOPRIL 10 MG PO TABS: 10.0000 mg | ORAL_TABLET | Freq: Two times a day (BID) | ORAL | Status: AC

## 2014-01-10 MED ORDER — METOPROLOL SUCCINATE ER 25 MG PO TB24: 25.0000 mg | ORAL_TABLET | Freq: Every day | ORAL | Status: DC

## 2014-01-10 MED ORDER — LISINOPRIL-HYDROCHLOROTHIAZIDE 20-25 MG PO TABS
1.0000 | ORAL_TABLET | Freq: Every day | ORAL | Status: DC
Start: 2014-01-10 — End: 2014-01-10

## 2014-01-10 NOTE — Patient Instructions (Signed)

## 2014-01-10 NOTE — Progress Notes (Signed)
Pre visit review using our clinic review tool, if applicable. No additional management support is needed unless otherwise documented below in the visit note. 

## 2014-01-11 NOTE — Telephone Encounter (Signed)
Patient dropped off new form. Form was completed and forwarded to Dr. Abner GreenspanBlyth for review/signature. JG//CMA

## 2014-01-14 MED ORDER — FERROUS FUMARATE-FOLIC ACID 324-1 MG PO TABS: 1.0000 | ORAL_TABLET | Freq: Every day | ORAL | Status: AC

## 2014-01-16 NOTE — Assessment & Plan Note (Addendum)
Patient encouraged to maintain heart healthy diet, regular exercise, adequate sleep. Consider daily probiotics. Take medications as prescribed. Labs reviewed with patient. Declines flu shot today. Acknowledges increased irritability and anxiety, agrees to increase exercise and add a beta blocker.

## 2014-01-16 NOTE — Assessment & Plan Note (Signed)
Encouraged heart healthy diet, increase exercise, avoid trans fats, consider a krill oil cap daily 

## 2014-01-16 NOTE — Assessment & Plan Note (Signed)
Increase leafy greens, consider increased lean red meat and using cast iron cookware. Continue to monitor, report any concerns 

## 2014-01-16 NOTE — Progress Notes (Signed)
Patient ID: Sherri Underwood, female   DOB: 05/05/1980, 33 y.o.   MRN: [id number] Sherri Underwood [id number] 03/31/1981 01/16/2014      Progress Note-Follow Up  Subjective  Chief Complaint  Chief Complaint  Patient presents with  . Annual Exam    physical    HPI  Patient is a 33 year old female in today for routine medical care. In today for annual exam. She had her Pap smear with gynecology in August 2000 and reports no abnormalities. Continues to follow annually with neurosurgery for imaging of her brain aneurysm but has had no concerning symptoms. Denies any headache or neurologic complaints. Her greatest complaint today is of increased anxiety. She reports increased irritability no clear reason denies suicidal ideation. Does endorse some anhedonia. Notes that her blood pressure has been elevated when she checks it. Most recently it was 140/94. Denies CP/palp/SOB/HA/congestion/fevers/GI or GU c/o. Taking meds as prescribed  Past Medical History  Diagnosis Date  . Hypertension   . Brain aneurysm 2-13  . HTN (hypertension) 05/28/2012  . Brain aneurysm 05/28/2012  . Thalassemia trait 05/28/2012  . Preventative health care 05/28/2012  . Anemia 01/07/2013  . Dyslipidemia 01/07/2013  . Abdominal pain, unspecified site 02/14/2013    Past Surgical History  Procedure Laterality Date  . Brain aneursym surgery  05-2011  . Ovarian cyst removal      on right, required 2 surgeries    Family History  Problem Relation Age of Onset  . Hypertension Mother   . Hyperlipidemia Mother   . Thyroid disease Mother   . Hypertension Father   . Alzheimer's disease Maternal Grandmother   . Hypertension Maternal Grandmother   . Hyperlipidemia Maternal Grandmother   . Diabetes Maternal Grandmother     type 2  . Parkinson's disease Maternal Grandfather   . Hypertension Paternal Grandfather   . Stroke Paternal Grandfather     History   Social History  . Marital Status: Married    Spouse Name: N/A     Number of Children: N/A  . Years of Education: N/A   Occupational History  . Not on file.   Social History Main Topics  . Smoking status: Never Smoker   . Smokeless tobacco: Never Used  . Alcohol Use: Yes     Comment: occasionally  . Drug Use: No  . Sexual Activity: Yes    Partners: Male     Comment: lives with husband and daughter, vegetarian, homemaker.    Other Topics Concern  . Not on file   Social History Narrative  . No narrative on file    Current Outpatient Prescriptions on File Prior to Visit  Medication Sig Dispense Refill  . aspirin 325 MG tablet Take 325 mg by mouth daily.      . Multiple Vitamin (MULTIVITAMIN) tablet Take 1 tablet by mouth daily.       No current facility-administered medications on file prior to visit.    No Known Allergies  Review of Systems  Review of Systems  Constitutional: Negative for fever and malaise/fatigue.  HENT: Negative for congestion.   Eyes: Negative for discharge.  Respiratory: Negative for shortness of breath.   Cardiovascular: Negative for chest pain, palpitations and leg swelling.  Gastrointestinal: Negative for nausea, abdominal pain and diarrhea.  Genitourinary: Negative for dysuria.  Musculoskeletal: Negative for falls.  Skin: Negative for rash.  Neurological: Negative for loss of consciousness and headaches.  Endo/Heme/Allergies: Negative for polydipsia.  Psychiatric/Behavioral: Negative for depression and suicidal ideas. The patient  is not nervous/anxious and does not have insomnia.     Objective  BP 126/104  Pulse 67  Temp(Src) 98.1 F (36.7 C) (Oral)  Ht 5' 3.5" (1.613 m)  Wt 155 lb (70.308 kg)  BMI 27.02 kg/m2  SpO2 100%  LMP 01/08/2014  Physical Exam  Physical Exam  Constitutional: She is oriented to person, place, and time and well-developed, well-nourished, and in no distress. No distress.  HENT:  Head: Normocephalic and atraumatic.  Right Ear: External ear normal.  Left Ear: External  ear normal.  Nose: Nose normal.  Mouth/Throat: Oropharynx is clear and moist. No oropharyngeal exudate.  Eyes: Conjunctivae are normal. Pupils are equal, round, and reactive to light. Right eye exhibits no discharge. Left eye exhibits no discharge. No scleral icterus.  Neck: Normal range of motion. Neck supple. No thyromegaly present.  Cardiovascular: Normal rate, regular rhythm, normal heart sounds and intact distal pulses.   No murmur heard. Pulmonary/Chest: Effort normal and breath sounds normal. No respiratory distress. She has no wheezes. She has no rales.  Abdominal: Soft. Bowel sounds are normal. She exhibits no distension and no mass. There is no tenderness.  Musculoskeletal: Normal range of motion. She exhibits no edema and no tenderness.  Lymphadenopathy:    She has no cervical adenopathy.  Neurological: She is alert and oriented to person, place, and time. She has normal reflexes. No cranial nerve deficit. Coordination normal.  Skin: Skin is warm and dry. No rash noted. She is not diaphoretic.  Psychiatric: Mood, memory and affect normal.    Lab Results  Component Value Date   TSH 4.30 01/10/2014   Lab Results  Component Value Date   WBC 6.0 01/10/2014   HGB 10.9* 01/10/2014   HCT 35.4* 01/10/2014   MCV 61.6* 01/10/2014   PLT 268.0 01/10/2014   Lab Results  Component Value Date   CREATININE 0.8 01/10/2014   BUN 8 01/10/2014   NA 138 01/10/2014   K 3.5 01/10/2014   CL 103 01/10/2014   CO2 26 01/10/2014   Lab Results  Component Value Date   ALT 32 01/10/2014   AST 23 01/10/2014   ALKPHOS 36* 01/10/2014   BILITOT 1.0 01/10/2014   Lab Results  Component Value Date   CHOL 173 01/10/2014   Lab Results  Component Value Date   HDL 33.40* 01/10/2014   Lab Results  Component Value Date   LDLCALC 59 12/31/2012   Lab Results  Component Value Date   TRIG 267.0* 01/10/2014   Lab Results  Component Value Date   CHOLHDL 5 01/10/2014     Assessment &  Plan  HTN (hypertension) Poorly controlled, will change to Lisinopril 10 mg twice daily and add Metoprolol XL 25 mg daily.   Anemia Increase leafy greens, consider increased lean red meat and using cast iron cookware. Continue to monitor, report any concerns  Dyslipidemia Encouraged heart healthy diet, increase exercise, avoid trans fats, consider a krill oil cap daily  Preventative health care Patient encouraged to maintain heart healthy diet, regular exercise, adequate sleep. Consider daily probiotics. Take medications as prescribed. Labs reviewed with patient. Declines flu shot today. Acknowledges increased irritability and anxiety, agrees to increase exercise and add a beta blocker.   Brain aneurysm Follows with neurosurgery for annual imaging. Feeling well.

## 2014-01-16 NOTE — Assessment & Plan Note (Signed)
Poorly controlled, will change to Lisinopril 10 mg twice daily and add Metoprolol XL 25 mg daily.

## 2014-01-16 NOTE — Assessment & Plan Note (Signed)
Follows with neurosurgery for annual imaging. Feeling well.

## 2014-01-17 NOTE — Telephone Encounter (Signed)
Received signed form on 01/17/2014. Form faxed to [facility] at [phone]. JG//CMA

## 2014-01-19 ENCOUNTER — Encounter: Payer: Self-pay | Admitting: Family Medicine

## 2014-01-19 ENCOUNTER — Other Ambulatory Visit: Payer: Self-pay | Admitting: Family Medicine

## 2014-01-19 MED ORDER — HYDROCHLOROTHIAZIDE 25 MG PO TABS: 25.0000 mg | ORAL_TABLET | Freq: Every day | ORAL | Status: DC

## 2014-01-25 ENCOUNTER — Encounter: Payer: Self-pay | Admitting: Family Medicine

## 2014-03-15 ENCOUNTER — Other Ambulatory Visit: Payer: Self-pay | Admitting: Family Medicine

## 2014-03-15 DIAGNOSIS — E785 Hyperlipidemia, unspecified: Secondary | ICD-10-CM

## 2014-03-15 DIAGNOSIS — I10 Essential (primary) hypertension: Secondary | ICD-10-CM

## 2014-03-15 DIAGNOSIS — D649 Anemia, unspecified: Secondary | ICD-10-CM

## 2014-03-15 MED ORDER — METOPROLOL SUCCINATE ER 25 MG PO TB24: 25.0000 mg | ORAL_TABLET | Freq: Every day | ORAL | Status: DC

## 2014-04-20 ENCOUNTER — Telehealth: Payer: Self-pay | Admitting: Family Medicine

## 2014-06-09 NOTE — Telephone Encounter (Signed)
closed

## 2014-07-21 ENCOUNTER — Other Ambulatory Visit: Payer: Self-pay | Admitting: Family Medicine

## 2014-07-21 DIAGNOSIS — D649 Anemia, unspecified: Secondary | ICD-10-CM

## 2014-07-21 DIAGNOSIS — I10 Essential (primary) hypertension: Secondary | ICD-10-CM

## 2014-07-21 DIAGNOSIS — E785 Hyperlipidemia, unspecified: Secondary | ICD-10-CM

## 2014-07-21 MED ORDER — METOPROLOL SUCCINATE ER 25 MG PO TB24: 25.0000 mg | ORAL_TABLET | Freq: Every day | ORAL | Status: AC

## 2014-07-21 MED ORDER — HYDROCHLOROTHIAZIDE 25 MG PO TABS: 25.0000 mg | ORAL_TABLET | Freq: Every day | ORAL | Status: DC

## 2014-09-08 ENCOUNTER — Other Ambulatory Visit: Payer: Self-pay | Admitting: Family Medicine

## 2014-09-08 MED ORDER — HYDROCHLOROTHIAZIDE 25 MG PO TABS: 25.0000 mg | ORAL_TABLET | Freq: Every day | ORAL | Status: DC

## 2014-10-04 ENCOUNTER — Other Ambulatory Visit: Payer: Self-pay | Admitting: Family Medicine

## 2014-10-04 MED ORDER — HYDROCHLOROTHIAZIDE 25 MG PO TABS: 25.0000 mg | ORAL_TABLET | Freq: Every day | ORAL | Status: AC

## 2016-05-12 IMAGING — CT CT ANGIO HEAD
1 of 9 series · 11 of 47 positions shown · IV contrast (70CC OMNI 350)
Comparison: Outside studies available on [HOSPITAL] PACS ([HOSPITAL]
[REDACTED]) post treatment brain MRA 11/07/2011, plain
Head CTs 05/07/2011 - 05/14/2011, a pretreatment CTA 05/07/2011, and
cerebral angiogram and embolization images on 05/08/2011.

ADDENDUM:
Study discussed by telephone with Dr. SASHOI TUZO on 12/24/2013 at
[DATE] .
CLINICAL DATA: 33-year-old female with history of right ICA
aneurysm treated with coil embolization in 3743. Intermittent
headaches and visual changes. Subsequent encounter.

EXAM:
CT ANGIOGRAPHY HEAD
TECHNIQUE: Multidetector CT imaging of the head was performed using the
standard protocol during bolus administration of intravenous
contrast. Multiplanar CT image reconstructions and MIPs were
obtained to evaluate the vascular anatomy.
CONTRAST:  70mL OMNIPAQUE IOHEXOL 350 MG/ML SOLN

[Series 501: axial thin · axial · 0.38mm/px · z∈[+242,+362]mm · 11 of 144 slices shown]
[im 12/144  brain]
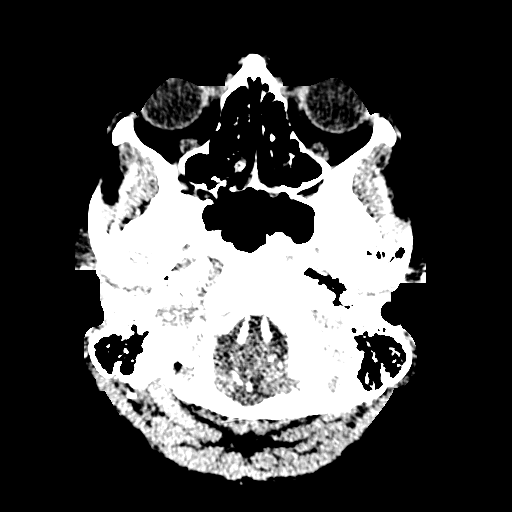
[im 24/144  bone]
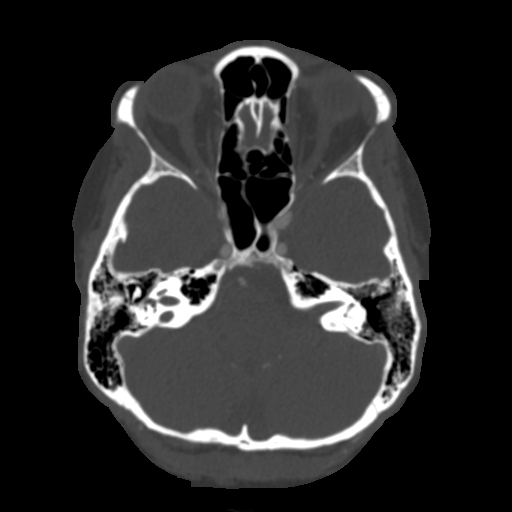
[im 36/144  brain]
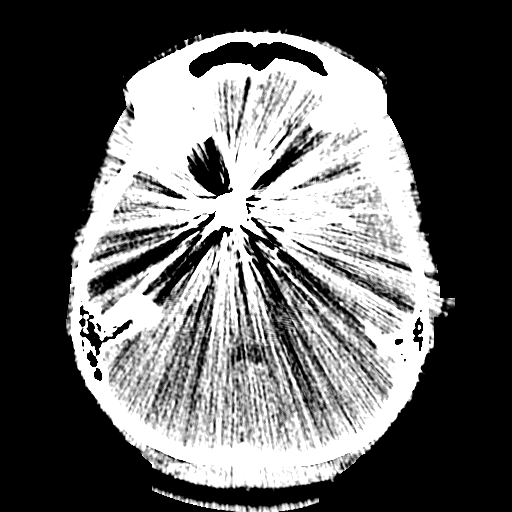
[im 48/144  bone]
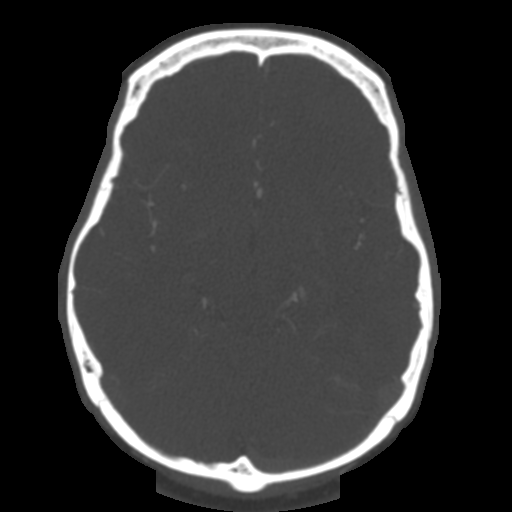
[im 60/144  brain]
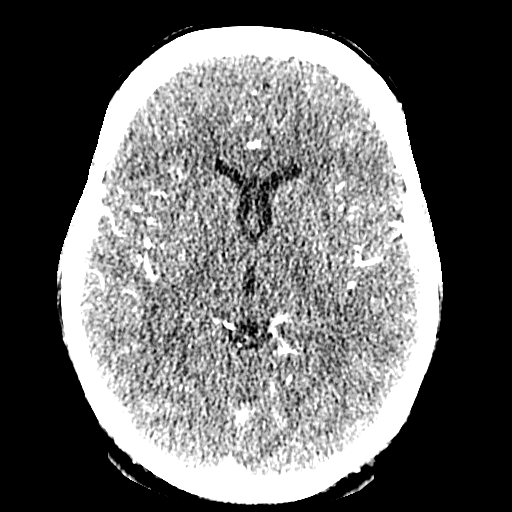
[im 72/144  bone]
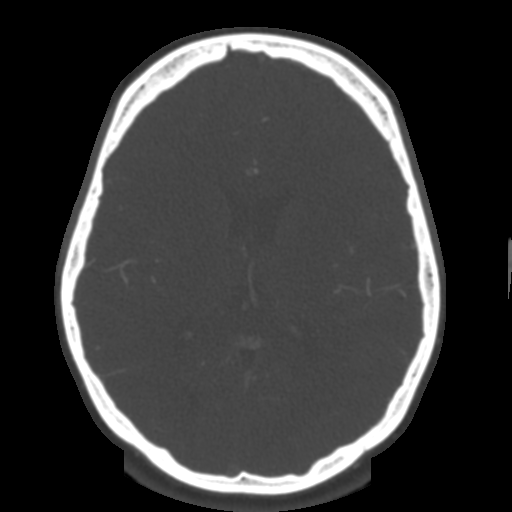
[im 84/144  brain]
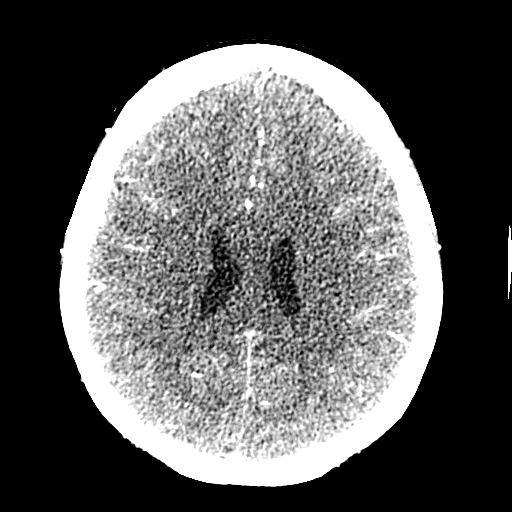
[im 96/144  bone]
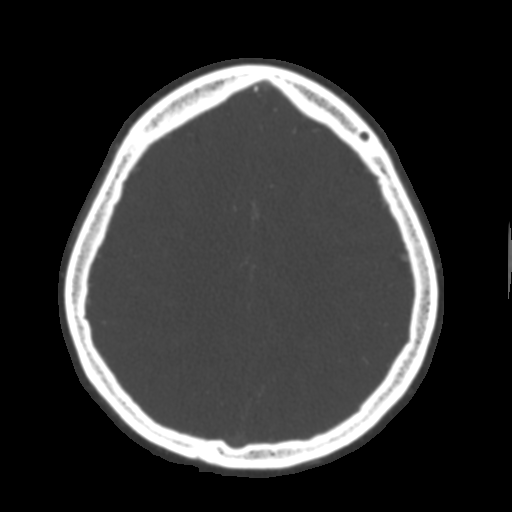
[im 108/144  brain]
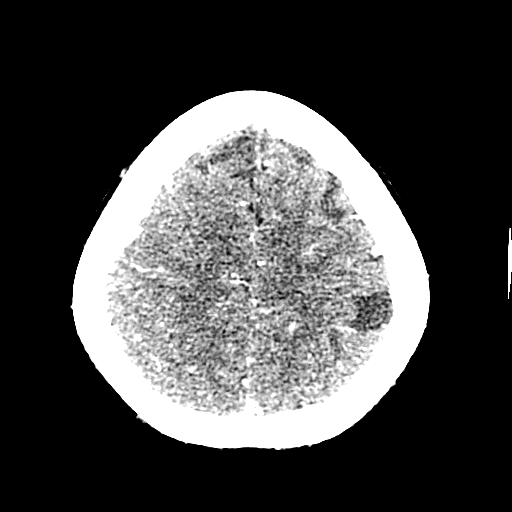
[im 120/144  bone]
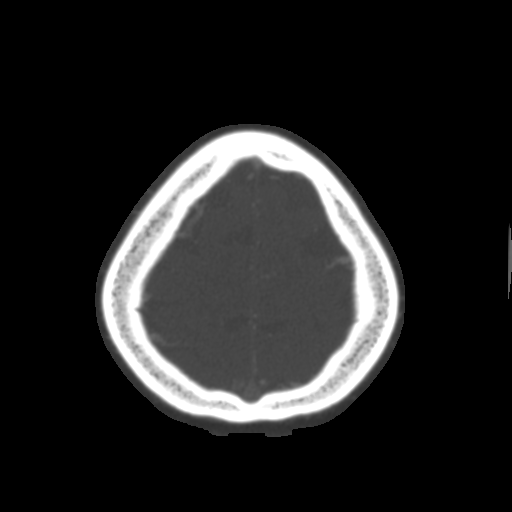
[im 132/144  brain]
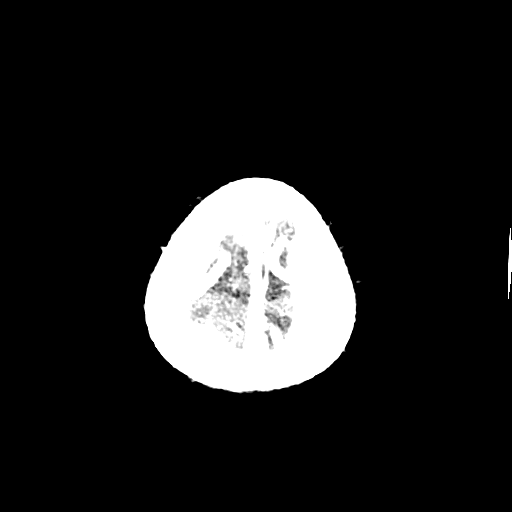

[11 of 47 positions shown; findings below may reference images not displayed]

FINDINGS: Visualized paranasal sinuses and mastoids are clear. No osseous
abnormality identified. Normal visible orbit and scalp soft tissues.

Distal right ICA coil pack with streak artifact. Cerebral volume is
normal. No midline shift, ventriculomegaly, mass effect, evidence of
mass lesion, intracranial hemorrhage or evidence of cortically based
acute infarction. Gray-white matter differentiation is within normal
limits throughout the brain. No encephalomalacia identified.

VASCULAR FINDINGS:

Major intracranial venous structures are normally enhancing.

Stable distal vertebral arteries, fairly codominant. Normal PICA
origins. Normal vertebrobasilar junction. Stable and normal basilar
artery. SCA and right PCA origins partially obscured by coil pack
artifact. Fetal type left PCA origin re - identified. Bilateral PCA
branches are within normal limits.

Negative distal cervical left ICA. Normal left ICA siphon. Normal
left ophthalmic and posterior communicating artery origins. Normal
left ICA terminus. Left MCA and ACA origins partially obscured by
coil pack streak artifact but appear within normal limits. Limited
visualization of the proximal ACA is due to streak artifact. Visible
A2 segments and distal bilateral ACA branches are within normal
limits. Left MCA branches are within normal limits.

Normal distal cervical right ICA. Normal proximal right ICA siphon.
No siphons stent identified. Adjacent to the supra clinoid segment
the large embolization coil pack appears stable in configuration.
Unfortunately streak artifact obscures detail of the aneurysm neck
which demonstrated faint residual flow signal on the 11/07/2011 MRA.
Also the right ophthalmic artery origin is obscured, but previously
was normal. The right ICA terminus is obscured.

Much of the right M1 segment is obscured. The right MCA bifurcation
is patent. Right MCA branches are within normal limits.

Review of the MIP images confirms the above findings.
IMPRESSION: 1. Stable large supraclinoid right ICA coil pack, unfortunately with
abundant CT streak artifact which obscures the right ICA terminus
including the area of the aneurysm neck which demonstrated some
residual flow signal on the outside post treatment MRA from
11/07/2011. Head MRA without contrast will be the modality of choice
for surveillance of this lesion.
2. Otherwise negative CT appearance of the brain, stable since
05/14/2011.
